# Patient Record
Sex: Male | Born: 1960
Health system: Southern US, Community
[De-identification: ages and names within clinical notes are randomized; demographics above are authoritative.]

## PROBLEM LIST (undated history)

## (undated) HISTORY — PX: SHOULDER SURGERY: SHX246

---

## 1998-12-17 ENCOUNTER — Emergency Department (HOSPITAL_COMMUNITY): Admission: EM | Admit: 1998-12-17 | Discharge: 1998-12-17 | Payer: Self-pay | Admitting: Emergency Medicine

## 2000-01-26 ENCOUNTER — Other Ambulatory Visit: Admission: RE | Admit: 2000-01-26 | Discharge: 2000-01-26 | Payer: Self-pay | Admitting: Urology

## 2002-01-05 ENCOUNTER — Emergency Department (HOSPITAL_COMMUNITY): Admission: EM | Admit: 2002-01-05 | Discharge: 2002-01-05 | Payer: Self-pay | Admitting: Emergency Medicine

## 2002-01-05 ENCOUNTER — Encounter: Payer: Self-pay | Admitting: Emergency Medicine

## 2008-11-16 ENCOUNTER — Encounter
Admission: RE | Admit: 2008-11-16 | Discharge: 2009-02-03 | Payer: Self-pay | Admitting: Physical Medicine & Rehabilitation

## 2008-11-17 ENCOUNTER — Ambulatory Visit: Payer: Self-pay | Admitting: Physical Medicine & Rehabilitation

## 2008-11-21 ENCOUNTER — Ambulatory Visit (HOSPITAL_COMMUNITY)
Admission: RE | Admit: 2008-11-21 | Discharge: 2008-11-21 | Payer: Self-pay | Admitting: Physical Medicine & Rehabilitation

## 2008-11-23 ENCOUNTER — Encounter
Admission: RE | Admit: 2008-11-23 | Discharge: 2008-12-03 | Payer: Self-pay | Admitting: Physical Medicine & Rehabilitation

## 2008-12-01 ENCOUNTER — Ambulatory Visit: Payer: Self-pay | Admitting: Physical Medicine & Rehabilitation

## 2008-12-03 ENCOUNTER — Ambulatory Visit: Payer: Self-pay | Admitting: Physical Medicine & Rehabilitation

## 2009-01-26 ENCOUNTER — Ambulatory Visit: Payer: Self-pay | Admitting: Physical Medicine & Rehabilitation

## 2010-08-04 ENCOUNTER — Encounter: Admission: RE | Admit: 2010-08-04 | Discharge: 2010-08-04 | Payer: Self-pay | Admitting: Occupational Medicine

## 2011-01-31 NOTE — Assessment & Plan Note (Signed)
A 50 year old male with lumbar degenerative disk.  He had a flare-up of  pain associated with annular tear.  He had lumbar extension mediated  pain.  However medial branch blocks were not helpful.  He has had no  radicular symptomatology.   In the interval time.  He has had improvements of his pain since I last  saw on December 03, 2008.  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  His average pain is 0.  He does have  some pain related to the right toe gout.  He gets about one flare-up a  year and takes colchicine for it.  He has had no other medical problems  in the interval time.  He is getting slowly back into his work out.   PHYSICAL EXAMINATION:  His blood pressure 146/67, pulse 78, respirations  18, and O2 saturation 96% on room air.   His Oswestry score 6%.   His back has no tenderness to palpation.  Lumbar spine range of motion  is full.  Lower extremity strength is normal.  Deep tendon reflexes are  normal.  He has hyperalgesia of his right great toe.   IMPRESSION:  Lumbar degenerative disk with flare-up in lumbar pain,  resolving.  No evidence radicular symptomatology.  We will go ahead and  advise him to slowly advance his exercises.  No medications needed at  this time.  I will see him back on a p.r.n. basis.      Erick Colace, M.D.  Electronically Signed     AEK/MedQ  D:  01/26/2009 11:12:09  T:  01/27/2009 40:10:27  Job #:  [id number]

## 2011-01-31 NOTE — Assessment & Plan Note (Signed)
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  No apparent injury, had onset of pain prior to getting ready for  church.  He went to [facility].  Prescription for  Celebrex and muscle relaxant without relief.  Chiropractic treatments  marginally effective.  He was told he has facet syndrome.  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  He was last seen  by me November 17, 2008.   His pain score is 1/10 currently, but gets up to 7 with activity.  He  has been trying to get back to some working out in the gym, but really  has not resumed his squat.  He has gone to physical therapy.  They are  showing him some cord strengthening exercises which he finds to be  helpful.  The pain is worse with walking and standing.  No real  improvement from medications.  He can climb steps.  He can drive.  He  continues to work full time.   PHYSICAL EXAMINATION:  Blood pressure 131/76, pulse 61, respirations 18,  O2 sat 99% room air.  Muscular, well-developed male in no acute  distress.  Orientation x3.  Affect is alert.  Gait is normal.  His upper  extremity and lower extremity strength is 5/5.  His back range of motion  is full and forward flexion and extension is around to 50% accompanied  by pain.  His deep tendon reflexes are normal.   We reviewed his MRI with the assistance of a spine model.  Basically, no  significant findings from T12 through L4.  At L4-L5, there is mild disk  degeneration, minimal facet joint changes at L5-S1, mild disk  degeneration, some adjacent bony reactive changes, mild facet  degenerative changes.  Also at L4-L5, he had a small paracentral right  annular tear.   IMPRESSION:  Lumbar facet mediated pain.  Pain with extension and  relieved by flexion.  The MRI is fairly unremarkable.   PLAN:  1. Continue physical therapy.  2. I advised him that should improve over the course in the next 2      months.   Advised against resuming squat with weights.  3. Should he not have any significant improvement in the next couple      months, we will recommend facet mediated branch blocks.      Erick Colace, M.D.  Electronically Signed     AEK/MedQ  D:  12/01/2008 15:31:11  T:  12/02/2008 03:03:33  Job #:  [id number]

## 2011-01-31 NOTE — Group Therapy Note (Signed)
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  He had no apparent injury.  He woke up, was doing okay and  then had onset of pain prior to getting ready for church.  He went to  [facility], had been given a  prescription for Celebrex a muscle relaxant without relief.  He has had  chiropractic treatments a couple visits which were marginally effective.  He is told that he has lumbar facet syndrome by chiropractic.  His pain  is described as sharp, constant.  Pain is 2/10, currently averaging 7.  Pain interferes with activity at 7/10 level.  Sleep is fair.  Pain is  worse with walking, sitting, standing.  The patient is able to climb  steps, able to drive, and walk without assistance.  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  His review of systems positive for  back spasms.   SOCIAL HISTORY:  He is married.   PAST SURGICAL HISTORY:  Shoulder surgery 10 years ago.   PHYSICAL EXAMINATION:  VITAL SIGNS:  His blood pressure is 142/68, pulse  79, respirations 18, O2 sat 96% on room air.  GENERAL:  A well-developed, well-nourished male in no acute distress.  Orientation x3.  NEUROLOGIC:  Affect is bright and alert.  Gait is normal.  EXTREMITIES:  Without edema.  Coordination normal.  Deep tendon reflexes  normal.  Sensation normal.   Straight leg raise is negative.  He has pain with extension of his  lumbar spine but now with forward flexion.   Gait is normal, able to toe walk and heel walk.  Deep sensation normal.  Deep tendon reflex __________.   IMPRESSION:  Lumbar axial pain, it is rather severe, has persisted  despite conservative care including chiropractic and nonsteroidal anti-  inflammatories.  He had no apparent injury.  He has had some  intermittent problems over the course of years, but this was really more  so than now what was usual for him.   RECOMMENDATIONS:  Will check a lumbar MRI so  that we can confirm the  proper course.  May need some further physical therapy, and I have  written for this twice a week for the next 2 weeks until I will see him  next in 2 weeks.  Consider lumbar facet injections.   Discussed with the patient who approves plan.  We will go ahead and  start some tramadol 50 mg b.i.d.      Erick Colace, M.D.  Electronically Signed     AEK/MedQ  D:  11/17/2008 17:16:07  T:  11/18/2008 05:30:20  Job #:  [id number]

## 2011-01-31 NOTE — Procedures (Signed)
Dakota Tanner, Dakota Tanner           ACCOUNT NO.:  [id number]   MEDICAL RECORD NO.:  [id number]          PATIENT TYPE:  REC   LOCATION:  [facility]                         FACILITY:  [facility]   PHYSICIAN:  Erick Colace, M.D.DATE OF BIRTH:  03-27-1961   DATE OF PROCEDURE:  12/03/2008  DATE OF DISCHARGE:                               OPERATIVE REPORT   PROCEDURE:  Right L5 dorsal ramus injection, right L4 medial branch  block, right L3 medial branch block under fluoroscopic guidance.   INDICATIONS:  Lumbar facet mediated pain with lumbar facet degenerative  changes noted on MRI.  Pain with extension only partially relieved by  medication management and other conservative care.   Informed consent was obtained after describing risks and benefits of the  procedure with the patient.  These include bleeding, bruising, and  infection.  He elected to proceed and has given written consent.  The  patient was placed prone on fluoroscopy table.  Betadine prep, sterile  drape, a 25-gauge 1-1/2-inch needle was used to anesthetize the skin and  subcutaneous tissue 1% lidocaine x2 mL.  Then, a 22-gauge 3-1/2-inch  spinal needle was inserted under fluoroscopic guidance starting at first  the right S1 SAP sacral ala junction.  Bone contact made, confirmed with  lateral imaging.  Omnipaque 180 x0.5 mL demonstrated no intravascular  uptake and 0.5 mL of dexamethasone and lidocaine solution was injected.  This consists of 1 mL of 4 mg/mL dexamethasone and 2 mL of 2% MPF  lidocaine.  Then, the right L5 SAP transverse process junction targeted.  Bone contact made, confirmed with lateral imaging.  Omnipaque 180 x0.5  mL demonstrated no intravascular uptake.  Then, 0.5 mL of dexamethasone  and lidocaine solution was injected.  Then, the right L4 SAP transverse  process junction targeted.  Bone contact made, confirmed lateral  imaging.  Omnipaque 180 x0.5 mL demonstrated no intravascular uptake.  Then, 0.5 mL of  the dexamethasone and lidocaine solution was injected.  The patient tolerated the procedure well.  Pre- and post-injection  vitals stable.  Post-injection instructions given.  No significant  reduction of pain, therefore facets do not seem to be pain generator.  Symptoms most likely from annular tear L4-5.      Erick Colace, M.D.  Electronically Signed     AEK/MEDQ  D:  12/03/2008 15:53:04  T:  12/04/2008 02:12:39  Job:  [id number]

## 2011-08-09 IMAGING — CR DG HAND COMPLETE 3+V*R*
4 series · 4 of 4 positions shown · non-contrast
Comparison: None.

CLINICAL DATA: Injured 2 days ago with pain

RIGHT HAND - COMPLETE 3+ VIEW

[view not recorded (1 of 4)]
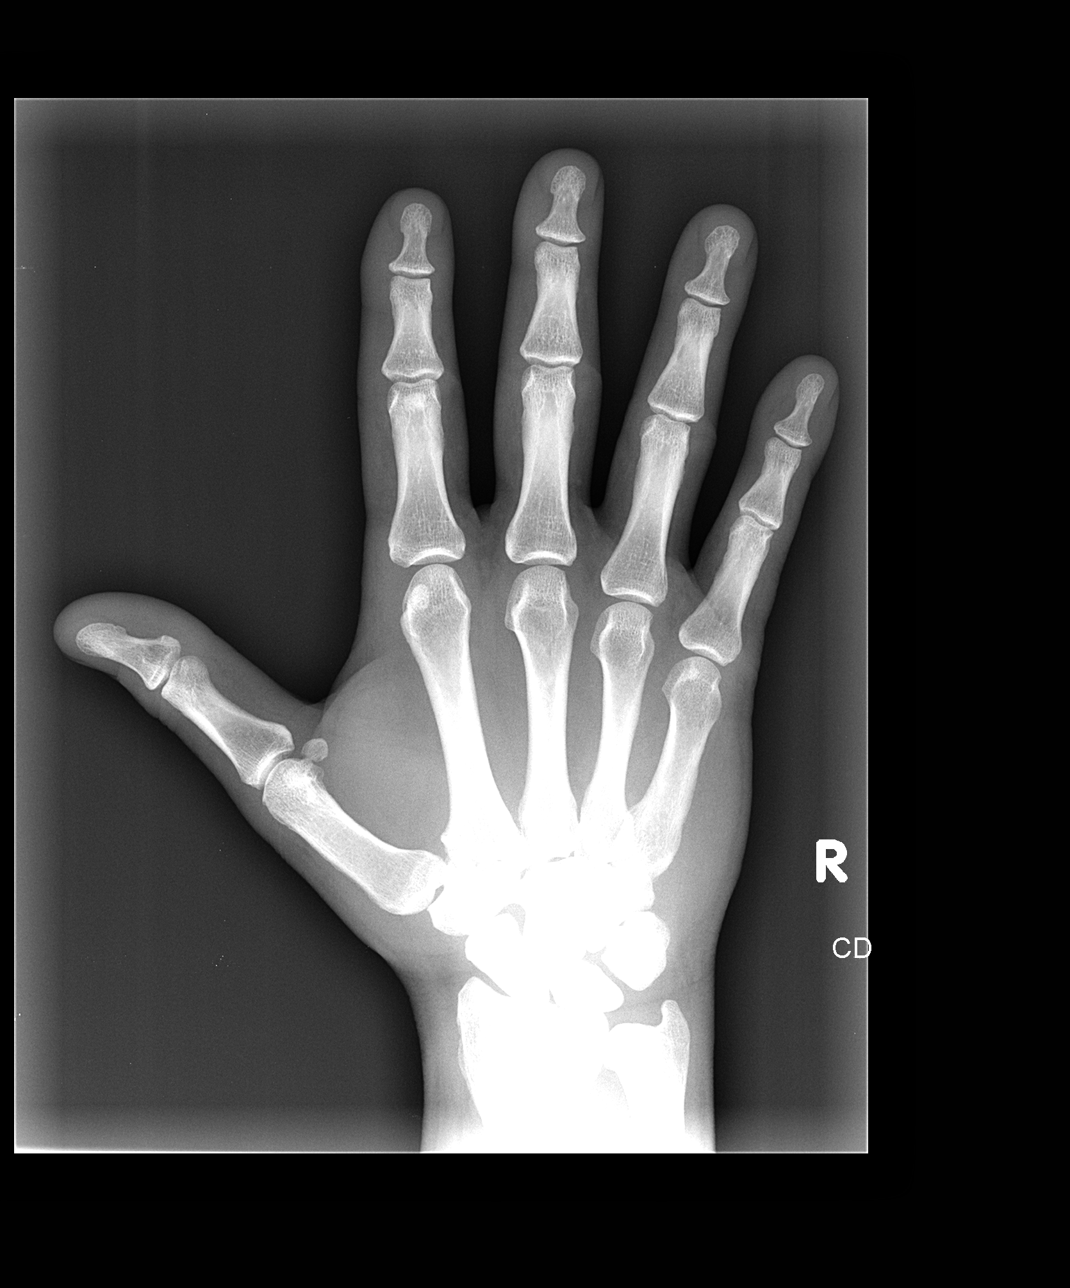

[view not recorded (2 of 4)]
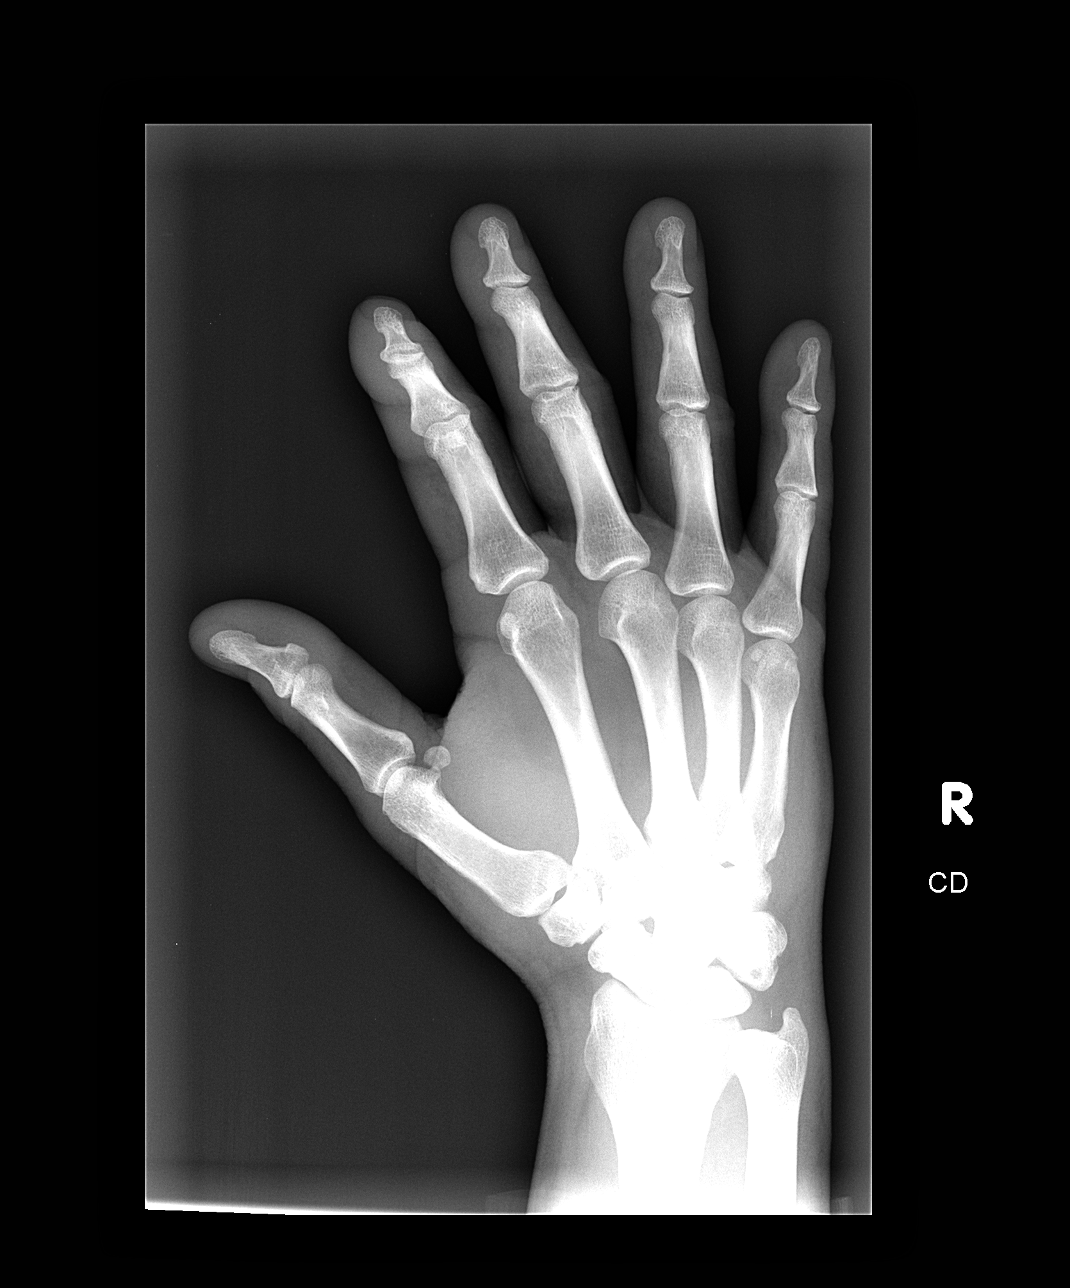

[view not recorded (3 of 4)]
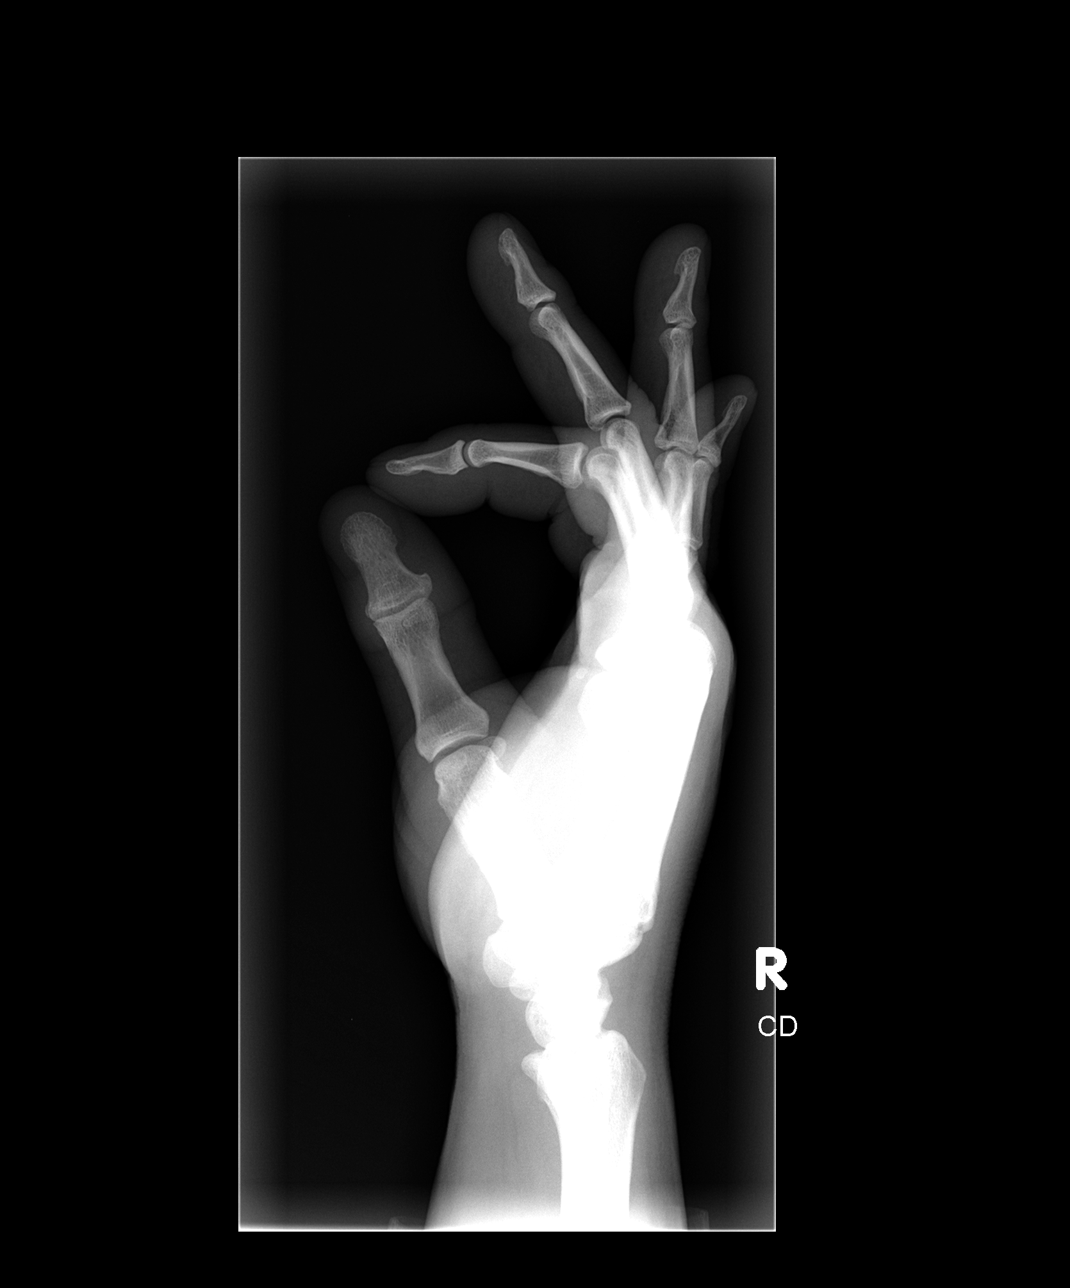

[view not recorded (4 of 4)]
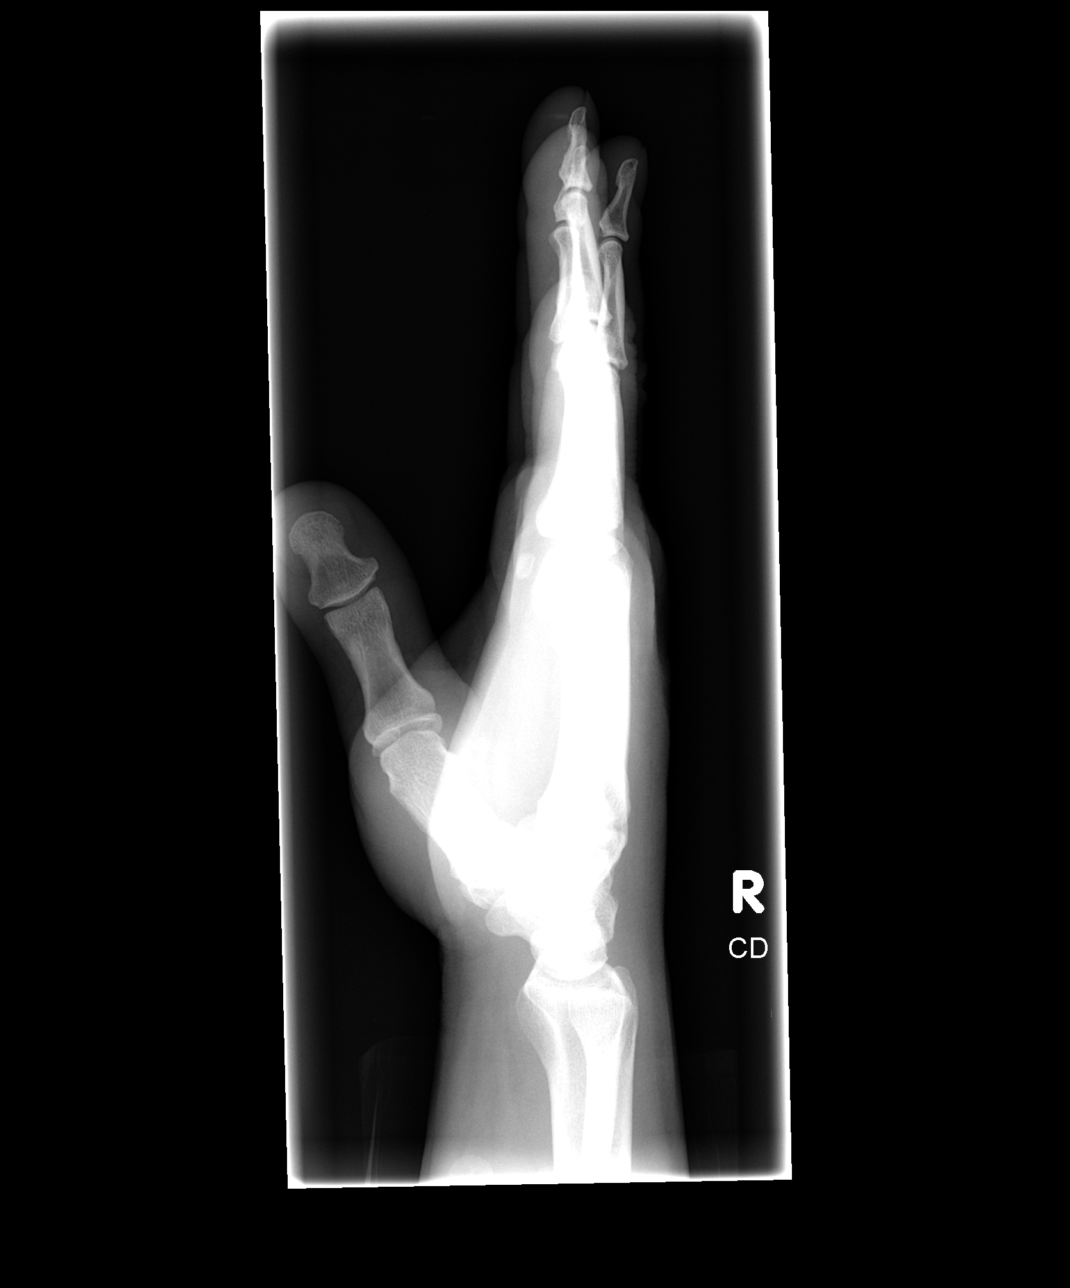

[4 of 4 positions shown; findings below may reference images not displayed]

FINDINGS: The radiocarpal joint space appears normal.  The ulnar
styloid is intact.  The carpal bones are in normal position.  MCP,
PIP, and DIP joints appear normal.  No fracture is seen.
IMPRESSION: Negative right hand.

## 2015-05-09 ENCOUNTER — Encounter (HOSPITAL_COMMUNITY): Payer: Self-pay | Admitting: *Deleted

## 2015-05-09 ENCOUNTER — Emergency Department (HOSPITAL_COMMUNITY)
Admission: EM | Admit: 2015-05-09 | Discharge: 2015-05-09 | Disposition: A | Discharge status: Home / Self Care | Payer: Worker's Compensation | Attending: Emergency Medicine | Admitting: Emergency Medicine

## 2015-05-09 DIAGNOSIS — W260XXA Contact with knife, initial encounter: Secondary | ICD-10-CM | POA: Insufficient documentation

## 2015-05-09 DIAGNOSIS — S61219A Laceration without foreign body of unspecified finger without damage to nail, initial encounter: Secondary | ICD-10-CM

## 2015-05-09 DIAGNOSIS — Z23 Encounter for immunization: Secondary | ICD-10-CM | POA: Diagnosis not present

## 2015-05-09 DIAGNOSIS — Y9389 Activity, other specified: Secondary | ICD-10-CM | POA: Diagnosis not present

## 2015-05-09 DIAGNOSIS — Y998 Other external cause status: Secondary | ICD-10-CM | POA: Insufficient documentation

## 2015-05-09 DIAGNOSIS — Y9289 Other specified places as the place of occurrence of the external cause: Secondary | ICD-10-CM | POA: Insufficient documentation

## 2015-05-09 DIAGNOSIS — S61211A Laceration without foreign body of left index finger without damage to nail, initial encounter: Secondary | ICD-10-CM | POA: Diagnosis present

## 2015-05-09 MED ORDER — LIDOCAINE HCL (PF) 1 % IJ SOLN
5.0000 mL | Freq: Once | INTRAMUSCULAR | Status: AC
  Administered 2015-05-09: 5 mL
  Filled 2015-05-09: qty 5

## 2015-05-09 MED ORDER — BACITRACIN ZINC 500 UNIT/GM EX OINT
1.0000 "application " | TOPICAL_OINTMENT | Freq: Two times a day (BID) | CUTANEOUS | Status: DC
  Administered 2015-05-09: 1 via TOPICAL
  Filled 2015-05-09: qty 0.9

## 2015-05-09 MED ORDER — TETANUS-DIPHTH-ACELL PERTUSSIS 5-2.5-18.5 LF-MCG/0.5 IM SUSP
0.5000 mL | Freq: Once | INTRAMUSCULAR | Status: AC
  Administered 2015-05-09: 0.5 mL via INTRAMUSCULAR
  Filled 2015-05-09: qty 0.5

## 2015-05-09 NOTE — ED Notes (Signed)
Pt stable, ambulatory, states understanding of discharge instructions 

## 2015-05-09 NOTE — ED Notes (Signed)
Pt states that he cut his finger with a knife while trying to cut handcuffs off of a prisoner. Left hand pointer finger.

## 2015-05-09 NOTE — ED Provider Notes (Signed)
CSN: [id number]     Arrival date & time 05/09/15  2000 History  This chart was scribed for Roxy Horseman, PA-C, working with Gwyneth Sprout, MD by Chestine Spore, ED Scribe. The patient was seen in room TR08C/TR08C at 8:38 PM.    Chief Complaint  Patient presents with  . Laceration     The history is provided by the patient. No language interpreter was used.    HPI Comments: Dakota Tanner is a 54 y.o. male who presents to the Emergency Department complaining of laceration onset 7:36 PM. Pt notes that he cut his left index finger with a sharp knife while trying to cut the handcuffs off of a prisoner. Pt notes that the finger continuously bled initially and it has since stopped. Pt is unsure of how deep the cut is but his finger is tingling due to his left index finger being wrapped tightly with gauze. Pt notes that his tetanus is not UTD. He denies joint swelling, color change, and any other symptoms.   History reviewed. No pertinent past medical history. Past Surgical History  Procedure Laterality Date  . Shoulder surgery     No family history on file. Social History  Substance Use Topics  . Smoking status: Never Smoker   . Smokeless tobacco: None  . Alcohol Use: No    Review of Systems  Musculoskeletal: Negative for joint swelling.  Skin: Positive for wound. Negative for color change and rash.      Allergies  Review of patient's allergies indicates no known allergies.  Home Medications   Prior to Admission medications   Not on File   BP 147/95 mmHg  Pulse 74  Temp(Src) 99 F (37.2 C) (Oral)  Resp 18  SpO2 95% Physical Exam  Constitutional: He is oriented to person, place, and time. He appears well-developed and well-nourished. No distress.  HENT:  Head: Normocephalic and atraumatic.  Eyes: EOM are normal.  Neck: Neck supple. No tracheal deviation present.  Cardiovascular: Normal rate.   Pulmonary/Chest: Effort normal. No respiratory distress.   Musculoskeletal: Normal range of motion.  Isolated flexion of L index, PIP, and DIP is 5/5, no evidence of tendon laceration.  Neurological: He is alert and oriented to person, place, and time.  Skin: Skin is warm and dry. Laceration noted.  L index finger remarkable for 2 cm laceration on the palmar aspect between the PIP and DIP.   Psychiatric: He has a normal mood and affect. His behavior is normal.  Nursing note and vitals reviewed.   ED Course  Procedures (including critical care time) DIAGNOSTIC STUDIES: Oxygen Saturation is 95% on RA, adequate by my interpretation.    COORDINATION OF CARE: 8:42 PM Discussed treatment plan with pt at bedside and pt agreed to plan.    Labs Review Labs Reviewed - No data to display  Imaging Review No results found. I have personally reviewed and evaluated these images and lab results as part of my medical decision-making.   EKG Interpretation None     LACERATION REPAIR Performed by: Roxy Horseman Authorized by: Roxy Horseman Consent: Verbal consent obtained. Risks and benefits: risks, benefits and alternatives were discussed Consent given by: patient Patient identity confirmed: provided demographic data Prepped and Draped in normal sterile fashion Wound explored  Laceration Location: left index finger  Laceration Length: 2cm  No Foreign Bodies seen or palpated  Anesthesia: local infiltration  Local anesthetic: lidocaine 2% without epinephrine  Anesthetic total: 5 ml  Irrigation method: syringe Amount of  cleaning: standard  Skin closure: 5-0 vicryl plus  Number of sutures: 5  Technique: interrupted  Patient tolerance: Patient tolerated the procedure well with no immediate complications.   MDM   Final diagnoses:  Finger laceration, initial encounter    Patient with laceration. Repaired in the emergency department. Tetanus shot updated. Will splint finger. Return precautions given. No evidence of deep  injury such as tendon laceration or bone involvement.  I personally performed the services described in this documentation, which was scribed in my presence. The recorded information has been reviewed and is accurate.     Roxy Horseman, PA-C 05/09/15 [id number]  Gwyneth Sprout, MD 05/12/15 [phone]

## 2015-05-09 NOTE — Discharge Instructions (Signed)

## 2017-03-15 DIAGNOSIS — R972 Elevated prostate specific antigen [PSA]: Secondary | ICD-10-CM | POA: Diagnosis not present

## 2017-12-13 DIAGNOSIS — M109 Gout, unspecified: Secondary | ICD-10-CM | POA: Diagnosis not present

## 2018-01-10 DIAGNOSIS — L719 Rosacea, unspecified: Secondary | ICD-10-CM | POA: Diagnosis not present

## 2018-01-10 DIAGNOSIS — M109 Gout, unspecified: Secondary | ICD-10-CM | POA: Diagnosis not present

## 2018-02-07 DIAGNOSIS — M109 Gout, unspecified: Secondary | ICD-10-CM | POA: Diagnosis not present

## 2018-03-20 DIAGNOSIS — J340 Abscess, furuncle and carbuncle of nose: Secondary | ICD-10-CM | POA: Diagnosis not present

## 2018-07-02 DIAGNOSIS — Z136 Encounter for screening for cardiovascular disorders: Secondary | ICD-10-CM | POA: Diagnosis not present

## 2018-07-02 DIAGNOSIS — Z0000 Encounter for general adult medical examination without abnormal findings: Secondary | ICD-10-CM | POA: Diagnosis not present
